# Patient Record
Sex: Female | Born: 1995 | Race: White | Hispanic: No | Marital: Single | Smoking: Never smoker
Health system: Southern US, Community
[De-identification: ages and names within clinical notes are randomized; demographics above are authoritative.]

## PROBLEM LIST (undated history)

## (undated) DIAGNOSIS — N200 Calculus of kidney: Secondary | ICD-10-CM

## (undated) HISTORY — PX: KIDNEY STONE SURGERY: SHX686

## (undated) HISTORY — PX: TONSILLECTOMY: SUR1361

---

## 2015-05-18 ENCOUNTER — Inpatient Hospital Stay (HOSPITAL_COMMUNITY)
Admission: EM | Admit: 2015-05-18 | Discharge: 2015-05-20 | DRG: 872 | Disposition: A | Discharge status: Home / Self Care | Payer: 59 | Attending: Internal Medicine | Admitting: Internal Medicine

## 2015-05-18 ENCOUNTER — Encounter (HOSPITAL_COMMUNITY): Payer: Self-pay | Admitting: Emergency Medicine

## 2015-05-18 ENCOUNTER — Emergency Department (HOSPITAL_COMMUNITY): Payer: 59

## 2015-05-18 DIAGNOSIS — R109 Unspecified abdominal pain: Secondary | ICD-10-CM

## 2015-05-18 DIAGNOSIS — I959 Hypotension, unspecified: Secondary | ICD-10-CM | POA: Diagnosis present

## 2015-05-18 DIAGNOSIS — A419 Sepsis, unspecified organism: Secondary | ICD-10-CM | POA: Diagnosis not present

## 2015-05-18 DIAGNOSIS — R1011 Right upper quadrant pain: Secondary | ICD-10-CM

## 2015-05-18 DIAGNOSIS — Z87442 Personal history of urinary calculi: Secondary | ICD-10-CM

## 2015-05-18 DIAGNOSIS — A0819 Acute gastroenteropathy due to other small round viruses: Secondary | ICD-10-CM | POA: Diagnosis present

## 2015-05-18 DIAGNOSIS — A047 Enterocolitis due to Clostridium difficile: Secondary | ICD-10-CM | POA: Diagnosis present

## 2015-05-18 DIAGNOSIS — A0811 Acute gastroenteropathy due to Norwalk agent: Secondary | ICD-10-CM | POA: Diagnosis present

## 2015-05-18 DIAGNOSIS — R000 Tachycardia, unspecified: Secondary | ICD-10-CM | POA: Diagnosis present

## 2015-05-18 DIAGNOSIS — R197 Diarrhea, unspecified: Secondary | ICD-10-CM

## 2015-05-18 DIAGNOSIS — Z8249 Family history of ischemic heart disease and other diseases of the circulatory system: Secondary | ICD-10-CM

## 2015-05-18 DIAGNOSIS — E872 Acidosis: Secondary | ICD-10-CM | POA: Diagnosis present

## 2015-05-18 DIAGNOSIS — E861 Hypovolemia: Secondary | ICD-10-CM | POA: Diagnosis present

## 2015-05-18 DIAGNOSIS — D649 Anemia, unspecified: Secondary | ICD-10-CM | POA: Diagnosis present

## 2015-05-18 DIAGNOSIS — Z793 Long term (current) use of hormonal contraceptives: Secondary | ICD-10-CM

## 2015-05-18 DIAGNOSIS — R112 Nausea with vomiting, unspecified: Secondary | ICD-10-CM | POA: Diagnosis present

## 2015-05-18 DIAGNOSIS — Z882 Allergy status to sulfonamides status: Secondary | ICD-10-CM

## 2015-05-18 DIAGNOSIS — E860 Dehydration: Secondary | ICD-10-CM | POA: Diagnosis present

## 2015-05-18 HISTORY — DX: Calculus of kidney: N20.0

## 2015-05-18 MED ORDER — MORPHINE SULFATE (PF) 4 MG/ML IV SOLN
6.0000 mg | Freq: Once | INTRAVENOUS | Status: AC
  Administered 2015-05-18: 6 mg via INTRAVENOUS
  Filled 2015-05-18: qty 2

## 2015-05-18 MED ORDER — ONDANSETRON HCL 4 MG/2ML IJ SOLN
4.0000 mg | Freq: Once | INTRAMUSCULAR | Status: AC
  Administered 2015-05-18: 4 mg via INTRAVENOUS
  Filled 2015-05-18: qty 2

## 2015-05-18 MED ORDER — SODIUM CHLORIDE 0.9 % IV BOLUS (SEPSIS)
2000.0000 mL | Freq: Once | INTRAVENOUS | Status: AC
Start: 2015-05-18 — End: 2015-05-19
  Administered 2015-05-18: 2000 mL via INTRAVENOUS

## 2015-05-18 NOTE — ED Notes (Signed)
Pt taken to US

## 2015-05-18 NOTE — ED Notes (Signed)
Pt states about two hours ago she had a sudden onset of abd pain with n/v/d. Pt states she is now having 10/10 lower back pain. Pt diaphoretic.

## 2015-05-18 NOTE — ED Provider Notes (Signed)
CSN: [id number]     Arrival date & time 05/18/15  2243 History  By signing my name below, I, Bethel BornBritney McCollum, attest that this documentation has been prepared under the direction and in the presence of Tomasita CrumbleAdeleke Londen Lorge, MD. Electronically Signed: Bethel BornBritney McCollum, ED Scribe. 05/18/2015. 11:24 PM    Chief Complaint  Patient presents with  . Abdominal Pain   The history is provided by the patient. No language interpreter was used.   Denise PitchHanah Avino is a 20 y.o. female who presents to the Emergency Department complaining of new, 10/10 in severity, sharp, upper abdominal pain with sudden onset 2 hours prior to arrival.  Associated symptoms include nausea, vomiting, diarrhea, and back pain. Pt was in her usual state of health earlier in the day and her symptoms started 1 hour after eating [employer]. She denies fever.   History reviewed. No pertinent past medical history. Past Surgical History  Procedure Laterality Date  . Tonsillectomy    . Kidney stone surgery     No family history on file. Social History  Substance Use Topics  . Smoking status: Never Smoker   . Smokeless tobacco: None  . Alcohol Use: No   OB History    No data available     Review of Systems  10 Systems reviewed and all are negative for acute change except as noted in the HPI.  Allergies  Sulfa antibiotics  Home Medications   Prior to Admission medications   Medication Sig Start Date End Date Taking? Authorizing Provider  YAZ 3-0.02 MG tablet Take 1 tablet by mouth daily. 05/02/15  Yes Historical Provider, MD   BP 83/65 mmHg  Pulse 137  Temp(Src) 97.7 F (36.5 C) (Oral)  SpO2 100%  LMP 05/16/2015 Physical Exam  Constitutional: She is oriented to person, place, and time. She appears well-developed and well-nourished. She appears distressed.  HENT:  Head: Normocephalic and atraumatic.  Nose: Nose normal.  Mouth/Throat: Oropharynx is clear and moist. No oropharyngeal exudate.  Eyes: Conjunctivae and EOM  are normal. Pupils are equal, round, and reactive to light. No scleral icterus.  Neck: Normal range of motion. Neck supple. No JVD present. No tracheal deviation present. No thyromegaly present.  Cardiovascular: Regular rhythm and normal heart sounds.  Tachycardia present.  Exam reveals no gallop and no friction rub.   No murmur heard. Pulmonary/Chest: Effort normal and breath sounds normal. No respiratory distress. She has no wheezes. She exhibits no tenderness.  Abdominal: Soft. Bowel sounds are normal. She exhibits no distension and no mass. There is tenderness. There is no rebound and no guarding.  RUQ TTP  Musculoskeletal: Normal range of motion. She exhibits no edema or tenderness.  Lymphadenopathy:    She has no cervical adenopathy.  Neurological: She is alert and oriented to person, place, and time. No cranial nerve deficit. She exhibits normal muscle tone.  Skin: Skin is warm and dry. No rash noted. No erythema. No pallor.  Nursing note and vitals reviewed.   ED Course  Procedures (including critical care time) DIAGNOSTIC STUDIES: Oxygen Saturation is 100% on RA,  normal by my interpretation.    COORDINATION OF CARE: 11:15 PM Discussed treatment plan which includes lab work, US of the RUQ, morphine, Zofran, and IVF with pt at bedside and pt agreed to plan.  Labs Review Labs Reviewed  COMPREHENSIVE METABOLIC PANEL - Abnormal; Notable for the following:    CO2 20 (*)    Glucose, Bld 156 (*)    All other components  within normal limits  CBC - Abnormal; Notable for the following:    WBC 16.7 (*)    Platelets 409 (*)    All other components within normal limits  URINALYSIS, ROUTINE W REFLEX MICROSCOPIC (NOT AT [facility]) - Abnormal; Notable for the following:    Color, Urine AMBER (*)    APPearance CLOUDY (*)    Ketones, ur 40 (*)    Leukocytes, UA SMALL (*)    All other components within normal limits  URINE MICROSCOPIC-ADD ON - Abnormal; Notable for the following:    Squamous  Epithelial / LPF 0-5 (*)    Bacteria, UA RARE (*)    All other components within normal limits  I-STAT CG4 LACTIC ACID, ED - Abnormal; Notable for the following:    Lactic Acid, Venous 3.80 (*)    All other components within normal limits  I-STAT CHEM 8, ED - Abnormal; Notable for the following:    Glucose, Bld 149 (*)    Calcium, Ion 1.09 (*)    Hemoglobin 15.6 (*)    All other components within normal limits  LIPASE, BLOOD  POC URINE PREG, ED  POC URINE PREG, ED  I-STAT BETA HCG BLOOD, ED ([facility], [facility], [facility] ONLY)  I-STAT CG4 LACTIC ACID, ED    Imaging Review Ct Abdomen Pelvis W Contrast  05/19/2015  CLINICAL DATA:  Acute onset of upper abdominal pain. Nausea, vomiting, diarrhea and lower back pain. Initial encounter. EXAM: CT ABDOMEN AND PELVIS WITH CONTRAST TECHNIQUE: Multidetector CT imaging of the abdomen and pelvis was performed using the standard protocol following bolus administration of intravenous contrast. CONTRAST:  30mL OMNIPAQUE IOHEXOL 300 MG/ML  SOLN COMPARISON:  Right upper quadrant ultrasound performed 05/18/2015 FINDINGS: The visualized lung bases are clear. The liver and spleen are unremarkable in appearance. The gallbladder is within normal limits. The pancreas and adrenal glands are unremarkable. The kidneys are unremarkable in appearance. There is no evidence of hydronephrosis. No renal or ureteral stones are seen. No perinephric stranding is appreciated. No free fluid is identified. The small bowel is unremarkable in appearance. The stomach is within normal limits. No acute vascular abnormalities are seen. The appendix is normal in caliber, without evidence of appendicitis. The colon is unremarkable in appearance. The bladder is mildly distended and grossly unremarkable. The uterus is unremarkable in appearance. The ovaries are relatively symmetric. No suspicious adnexal masses are seen. No inguinal lymphadenopathy is seen. No acute osseous abnormalities are identified.  IMPRESSION: Unremarkable noncontrast CT of the abdomen and pelvis. Electronically Signed   By: Roanna Raider M.D.   On: 05/19/2015 03:17   US Abdomen Limited Ruq  05/19/2015  CLINICAL DATA:  20 year old female with right upper quadrant abdominal pain EXAM: US ABDOMEN LIMITED - RIGHT UPPER QUADRANT COMPARISON:  None. FINDINGS: Gallbladder: No gallstones or wall thickening visualized. Evaluation for sonographic Murphy's sign was limited as patient was on pain medication. Common bile duct: Diameter: 3 mm Liver: No focal lesion identified. Within normal limits in parenchymal echogenicity. IMPRESSION: Unremarkable right upper quadrant ultrasound. Electronically Signed   By: Elgie Collard M.D.   On: 05/19/2015 00:14   I have personally reviewed and evaluated these images and lab results as part of my medical decision-making.   EKG Interpretation None      MDM   Final diagnoses:  Abdominal pain    Patient presents to the ED for severe abdominal pain of sudden onset this evening.  She states it was 1 hour after eating, I have concern  for possible RUQ pathology and will order [place].  She is hypotensive and tachycardic.  She was given IVF, morphine and zofran for her symptoms.    Patient continues to have hypotension and abdominal pain.  I am unsure what is causing this as all of her studies are unremarkable.  WBC is 16, lactate is 3.  Will admit to Dr. Zane Herald to telemetry for further care.  He advised me to place the patient on cipro/flagyl for treatment.   I personally performed the services described in this documentation, which was scribed in my presence. The recorded information has been reviewed and is accurate.       Tomasita Crumble, MD 05/19/15 [id number]

## 2015-05-19 ENCOUNTER — Encounter (HOSPITAL_COMMUNITY): Payer: Self-pay | Admitting: Radiology

## 2015-05-19 ENCOUNTER — Emergency Department (HOSPITAL_COMMUNITY): Payer: 59

## 2015-05-19 DIAGNOSIS — E860 Dehydration: Secondary | ICD-10-CM | POA: Diagnosis present

## 2015-05-19 DIAGNOSIS — A0819 Acute gastroenteropathy due to other small round viruses: Secondary | ICD-10-CM | POA: Diagnosis present

## 2015-05-19 DIAGNOSIS — R112 Nausea with vomiting, unspecified: Secondary | ICD-10-CM | POA: Diagnosis not present

## 2015-05-19 DIAGNOSIS — Z882 Allergy status to sulfonamides status: Secondary | ICD-10-CM | POA: Diagnosis not present

## 2015-05-19 DIAGNOSIS — R109 Unspecified abdominal pain: Secondary | ICD-10-CM | POA: Diagnosis not present

## 2015-05-19 DIAGNOSIS — A047 Enterocolitis due to Clostridium difficile: Secondary | ICD-10-CM | POA: Diagnosis present

## 2015-05-19 DIAGNOSIS — Z87442 Personal history of urinary calculi: Secondary | ICD-10-CM | POA: Diagnosis not present

## 2015-05-19 DIAGNOSIS — A419 Sepsis, unspecified organism: Principal | ICD-10-CM

## 2015-05-19 DIAGNOSIS — R197 Diarrhea, unspecified: Secondary | ICD-10-CM | POA: Diagnosis not present

## 2015-05-19 DIAGNOSIS — Z793 Long term (current) use of hormonal contraceptives: Secondary | ICD-10-CM | POA: Diagnosis not present

## 2015-05-19 DIAGNOSIS — E872 Acidosis: Secondary | ICD-10-CM | POA: Diagnosis present

## 2015-05-19 DIAGNOSIS — A0811 Acute gastroenteropathy due to Norwalk agent: Secondary | ICD-10-CM | POA: Diagnosis not present

## 2015-05-19 DIAGNOSIS — Z8249 Family history of ischemic heart disease and other diseases of the circulatory system: Secondary | ICD-10-CM | POA: Diagnosis not present

## 2015-05-19 DIAGNOSIS — D649 Anemia, unspecified: Secondary | ICD-10-CM | POA: Diagnosis present

## 2015-05-19 DIAGNOSIS — R000 Tachycardia, unspecified: Secondary | ICD-10-CM | POA: Diagnosis present

## 2015-05-19 DIAGNOSIS — E861 Hypovolemia: Secondary | ICD-10-CM | POA: Diagnosis present

## 2015-05-19 DIAGNOSIS — I959 Hypotension, unspecified: Secondary | ICD-10-CM | POA: Diagnosis present

## 2015-05-19 LAB — COMPREHENSIVE METABOLIC PANEL
ALK PHOS: 29 U/L — AB (ref 38–126)
ALT: 16 U/L (ref 14–54)
ALT: 12 U/L — AB (ref 14–54)
AST: 24 U/L (ref 15–41)
AST: 16 U/L (ref 15–41)
Albumin: 3.9 g/dL (ref 3.5–5.0)
Albumin: 2.8 g/dL — ABNORMAL LOW (ref 3.5–5.0)
Alkaline Phosphatase: 42 U/L (ref 38–126)
Anion gap: 13 (ref 5–15)
Anion gap: 9 (ref 5–15)
BILIRUBIN TOTAL: 0.7 mg/dL (ref 0.3–1.2)
BILIRUBIN TOTAL: 0.7 mg/dL (ref 0.3–1.2)
BUN: 13 mg/dL (ref 6–20)
BUN: 10 mg/dL (ref 6–20)
CALCIUM: 7.5 mg/dL — AB (ref 8.9–10.3)
CHLORIDE: 109 mmol/L (ref 101–111)
CO2: 20 mmol/L — ABNORMAL LOW (ref 22–32)
CO2: 20 mmol/L — ABNORMAL LOW (ref 22–32)
CREATININE: 0.88 mg/dL (ref 0.44–1.00)
CREATININE: 0.76 mg/dL (ref 0.44–1.00)
Calcium: 9.4 mg/dL (ref 8.9–10.3)
Chloride: 114 mmol/L — ABNORMAL HIGH (ref 101–111)
GFR calc Af Amer: >60 mL/min (ref >60)
Glucose, Bld: 156 mg/dL — ABNORMAL HIGH (ref 65–99)
Glucose, Bld: 118 mg/dL — ABNORMAL HIGH (ref 65–99)
Potassium: 4.2 mmol/L (ref 3.5–5.1)
Potassium: 4 mmol/L (ref 3.5–5.1)
Sodium: 142 mmol/L (ref 135–145)
Sodium: 143 mmol/L (ref 135–145)
TOTAL PROTEIN: 6.6 g/dL (ref 6.5–8.1)
TOTAL PROTEIN: 4.4 g/dL — AB (ref 6.5–8.1)

## 2015-05-19 LAB — CBC
HCT: 41.8 % (ref 36.0–46.0)
HCT: 32 % — ABNORMAL LOW (ref 36.0–46.0)
HEMOGLOBIN: 10.7 g/dL — AB (ref 12.0–15.0)
Hemoglobin: 13.8 g/dL (ref 12.0–15.0)
MCH: 28.2 pg (ref 26.0–34.0)
MCH: 28.9 pg (ref 26.0–34.0)
MCHC: 33 g/dL (ref 30.0–36.0)
MCHC: 33.4 g/dL (ref 30.0–36.0)
MCV: 85.5 fL (ref 78.0–100.0)
MCV: 86.5 fL (ref 78.0–100.0)
PLATELETS: 409 10*3/uL — AB (ref 150–400)
PLATELETS: 288 10*3/uL (ref 150–400)
RBC: 4.89 MIL/uL (ref 3.87–5.11)
RBC: 3.7 MIL/uL — AB (ref 3.87–5.11)
RDW: 12 % (ref 11.5–15.5)
RDW: 12.4 % (ref 11.5–15.5)
WBC: 16.7 10*3/uL — AB (ref 4.0–10.5)
WBC: 8.2 10*3/uL (ref 4.0–10.5)

## 2015-05-19 LAB — C DIFFICILE QUICK SCREEN W PCR REFLEX
C Diff antigen: POSITIVE — AB
C Diff toxin: NEGATIVE

## 2015-05-19 LAB — URINE MICROSCOPIC-ADD ON

## 2015-05-19 LAB — I-STAT CHEM 8, ED
BUN: 15 mg/dL (ref 6–20)
CALCIUM ION: 1.09 mmol/L — AB (ref 1.12–1.23)
Chloride: 106 mmol/L (ref 101–111)
Creatinine, Ser: 0.7 mg/dL (ref 0.44–1.00)
GLUCOSE: 149 mg/dL — AB (ref 65–99)
HCT: 46 % (ref 36.0–46.0)
HEMOGLOBIN: 15.6 g/dL — AB (ref 12.0–15.0)
Potassium: 4.1 mmol/L (ref 3.5–5.1)
SODIUM: 141 mmol/L (ref 135–145)
TCO2: 20 mmol/L (ref 0–100)

## 2015-05-19 LAB — I-STAT BETA HCG BLOOD, ED (MC, WL, AP ONLY)

## 2015-05-19 LAB — GASTROINTESTINAL PANEL BY PCR, STOOL (REPLACES STOOL CULTURE)
ADENOVIRUS F40/41: NOT DETECTED
Astrovirus: NOT DETECTED
CYCLOSPORA CAYETANENSIS: NOT DETECTED
Campylobacter species: NOT DETECTED
Cryptosporidium: NOT DETECTED
E. COLI O157: NOT DETECTED
ENTAMOEBA HISTOLYTICA: NOT DETECTED
ENTEROPATHOGENIC E COLI (EPEC): NOT DETECTED
Enteroaggregative E coli (EAEC): NOT DETECTED
Enterotoxigenic E coli (ETEC): NOT DETECTED
Giardia lamblia: NOT DETECTED
Norovirus GI/GII: DETECTED — AB
Plesimonas shigelloides: NOT DETECTED
ROTAVIRUS A: NOT DETECTED
SAPOVIRUS (I, II, IV, AND V): NOT DETECTED
SHIGA LIKE TOXIN PRODUCING E COLI (STEC): NOT DETECTED
SHIGELLA/ENTEROINVASIVE E COLI (EIEC): NOT DETECTED
Salmonella species: NOT DETECTED
VIBRIO SPECIES: NOT DETECTED
Vibrio cholerae: NOT DETECTED
YERSINIA ENTEROCOLITICA: NOT DETECTED

## 2015-05-19 LAB — PROCALCITONIN: Procalcitonin: 2.5 ng/mL

## 2015-05-19 LAB — HIV ANTIBODY (ROUTINE TESTING W REFLEX): HIV Screen 4th Generation wRfx: NONREACTIVE

## 2015-05-19 LAB — URINALYSIS, ROUTINE W REFLEX MICROSCOPIC
Bilirubin Urine: NEGATIVE
GLUCOSE, UA: NEGATIVE mg/dL
Hgb urine dipstick: NEGATIVE
KETONES UR: 40 mg/dL — AB
NITRITE: NEGATIVE
PROTEIN: NEGATIVE mg/dL
Specific Gravity, Urine: 1.021 (ref 1.005–1.030)
pH: 6 (ref 5.0–8.0)

## 2015-05-19 LAB — I-STAT CG4 LACTIC ACID, ED
LACTIC ACID, VENOUS: 0.82 mmol/L (ref 0.5–2.0)
LACTIC ACID, VENOUS: 3.8 mmol/L — AB (ref 0.5–2.0)

## 2015-05-19 LAB — PROTIME-INR
INR: 1.36 (ref 0.00–1.49)
Prothrombin Time: 16.9 seconds — ABNORMAL HIGH (ref 11.6–15.2)

## 2015-05-19 LAB — LACTIC ACID, PLASMA
LACTIC ACID, VENOUS: 1 mmol/L (ref 0.5–2.0)
Lactic Acid, Venous: 1.1 mmol/L (ref 0.5–2.0)

## 2015-05-19 LAB — POC URINE PREG, ED: PREG TEST UR: NEGATIVE

## 2015-05-19 LAB — APTT: aPTT: 26 seconds (ref 24–37)

## 2015-05-19 LAB — LIPASE, BLOOD: LIPASE: 32 U/L (ref 11–51)

## 2015-05-19 MED ORDER — FENTANYL CITRATE (PF) 100 MCG/2ML IJ SOLN
50.0000 ug | Freq: Once | INTRAMUSCULAR | Status: AC
  Administered 2015-05-19: 50 ug via INTRAVENOUS
  Filled 2015-05-19: qty 2

## 2015-05-19 MED ORDER — METOCLOPRAMIDE HCL 5 MG/ML IJ SOLN
10.0000 mg | Freq: Once | INTRAMUSCULAR | Status: AC
Start: 2015-05-19 — End: 2015-05-19
  Administered 2015-05-19: 10 mg via INTRAVENOUS
  Filled 2015-05-19: qty 2

## 2015-05-19 MED ORDER — ENOXAPARIN SODIUM 40 MG/0.4ML ~~LOC~~ SOLN: 40.0000 mg | SUBCUTANEOUS | Status: DC

## 2015-05-19 MED ORDER — ACETAMINOPHEN 650 MG RE SUPP: 650.0000 mg | Freq: Four times a day (QID) | RECTAL | Status: DC | PRN

## 2015-05-19 MED ORDER — DROSPIRENONE-ETHINYL ESTRADIOL 3-0.02 MG PO TABS: 1.0000 | ORAL_TABLET | Freq: Every day | ORAL | Status: DC

## 2015-05-19 MED ORDER — DIPHENHYDRAMINE HCL 50 MG/ML IJ SOLN
25.0000 mg | Freq: Once | INTRAMUSCULAR | Status: AC
  Administered 2015-05-19: 25 mg via INTRAVENOUS
  Filled 2015-05-19: qty 1

## 2015-05-19 MED ORDER — SODIUM CHLORIDE 0.9% FLUSH: 3.0000 mL | Freq: Two times a day (BID) | INTRAVENOUS | Status: DC

## 2015-05-19 MED ORDER — ONDANSETRON HCL 4 MG/2ML IJ SOLN: 4.0000 mg | Freq: Three times a day (TID) | INTRAMUSCULAR | Status: DC | PRN

## 2015-05-19 MED ORDER — IOHEXOL 300 MG/ML  SOLN
80.0000 mL | Freq: Once | INTRAMUSCULAR | Status: AC | PRN
  Administered 2015-05-19: 30 mL via INTRAVENOUS

## 2015-05-19 MED ORDER — MORPHINE SULFATE (PF) 2 MG/ML IV SOLN
2.0000 mg | INTRAVENOUS | Status: DC | PRN
  Administered 2015-05-19: 2 mg via INTRAVENOUS
  Filled 2015-05-19: qty 1

## 2015-05-19 MED ORDER — METRONIDAZOLE IN NACL 5-0.79 MG/ML-% IV SOLN
500.0000 mg | Freq: Once | INTRAVENOUS | Status: DC
Start: 2015-05-19 — End: 2015-05-19

## 2015-05-19 MED ORDER — CIPROFLOXACIN IN D5W 400 MG/200ML IV SOLN: 400.0000 mg | Freq: Two times a day (BID) | INTRAVENOUS | Status: DC

## 2015-05-19 MED ORDER — SODIUM CHLORIDE 0.9 % IV SOLN
INTRAVENOUS | Status: DC
  Administered 2015-05-19 – 2015-05-20 (×2): via INTRAVENOUS

## 2015-05-19 MED ORDER — METRONIDAZOLE IN NACL 5-0.79 MG/ML-% IV SOLN
500.0000 mg | Freq: Three times a day (TID) | INTRAVENOUS | Status: DC
  Administered 2015-05-19 (×2): 500 mg via INTRAVENOUS
  Filled 2015-05-19 (×2): qty 100

## 2015-05-19 MED ORDER — SODIUM CHLORIDE 0.9 % IV BOLUS (SEPSIS)
1000.0000 mL | Freq: Once | INTRAVENOUS | Status: AC
  Administered 2015-05-19: 1000 mL via INTRAVENOUS

## 2015-05-19 MED ORDER — CIPROFLOXACIN IN D5W 400 MG/200ML IV SOLN
400.0000 mg | Freq: Once | INTRAVENOUS | Status: AC
  Administered 2015-05-19: 400 mg via INTRAVENOUS
  Filled 2015-05-19: qty 200

## 2015-05-19 MED ORDER — VANCOMYCIN 50 MG/ML ORAL SOLUTION
125.0000 mg | Freq: Four times a day (QID) | ORAL | Status: DC
  Administered 2015-05-19 – 2015-05-20 (×4): 125 mg via ORAL
  Filled 2015-05-19 (×4): qty 2.5
  Filled 2015-05-19: qty 3
  Filled 2015-05-19: qty 2.5

## 2015-05-19 MED ORDER — ACETAMINOPHEN 325 MG PO TABS
650.0000 mg | ORAL_TABLET | Freq: Four times a day (QID) | ORAL | Status: DC | PRN
  Administered 2015-05-19: 650 mg via ORAL
  Filled 2015-05-19: qty 2

## 2015-05-19 NOTE — Progress Notes (Signed)
[facility] PROGRESS NOTE  Denise Joyce MVH:[id number] DOB: 12/16/1995 DOA: 05/18/2015  PCP: No primary care provider on file.  Brief HPI: 20 year old Caucasian female with a past medical history of nephrolithiasis, presented with nausea, vomiting, diarrhea and abdominal pain. CT scan of the abdomen, pelvis was unremarkable for any acute process. Right upper quadrant ultrasound did not show any acute findings in the hepatobiliary system. Patient was hospitalized for further management.  Past medical history:  Past Medical History  Diagnosis Date  . Kidney stone     Consultants: None  Procedures: None  Antibiotics: Intravenous Cipro and Flagyl Oral vancomycin  Subjective: Patient continues to have nausea and has had one episode of vomiting overnight. She's had 3 episodes of loose stool which is not as loose as yesterday but with small quantity of blood. Her symptoms started yesterday evening. 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 Otherwise, denies any sick contacts. She did eat at a [employer] yesterday. However, her boyfriend also ate there and did not have any symptoms. Denies any recent antibiotic use.  Objective: Vital Signs  Filed Vitals:   05/19/15 0400 05/19/15 0430 05/19/15 0523 05/19/15 0625  BP: 95/41 95/51  95/33  Pulse: 98 96  103  Temp:   99.3 F (37.4 C) 100.1 F (37.8 C)  TempSrc:    Oral  Resp:    16  Height:    5\' 6"  (1.676 m)  Weight:    72.303 kg (159 lb 6.4 oz)  SpO2: 98% 99%  100%    Intake/Output Summary (Last 24 hours) at 05/19/15 1241 Last data filed at 05/19/15 1100  Gross per 24 hour  Intake   4120 ml  Output    300 ml  Net   3820 ml   Filed Weights   05/19/15 0625  Weight: 72.303 kg (159 lb 6.4 oz)    General appearance: alert, cooperative, appears stated age and no distress Resp: clear to auscultation bilaterally Cardio: regular rate and rhythm, S1, S2 normal, no murmur, click, rub or gallop GI: Abdomen is  soft. Tender in the right upper quadrant without any rebound, rigidity or guarding. No masses or organomegaly. Bowel sounds are present.  Examination of the back did not reveal any tenderness in the costovertebral angle. She did have some discomfort in the lower back and paraspinal areas. Extremities: extremities normal, atraumatic, no cyanosis or edema. Able to lift both legs off the bed without any difficulty. Neurologic: Awake and alert. Oriented 3. No focal neurological deficits.  Lab Results:  Basic Metabolic Panel:  Recent Labs Lab 05/18/15 2315 05/19/15 0011 05/19/15 0456  NA 142 141 143  K 4.2 4.1 4.0  CL 109 106 114*  CO2 20*  --  20*  GLUCOSE 156* 149* 118*  BUN 13 15 10   CREATININE 0.88 0.70 0.76  CALCIUM 9.4  --  7.5*   Liver Function Tests:  Recent Labs Lab 05/18/15 2315 05/19/15 0456  AST 24 16  ALT 16 12*  ALKPHOS 42 29*  BILITOT 0.7 0.7  PROT 6.6 4.4*  ALBUMIN 3.9 2.8*    Recent Labs Lab 05/18/15 2315  LIPASE 32   CBC:  Recent Labs Lab 05/18/15 2315 05/19/15 0011 05/19/15 0457  WBC 16.7*  --  8.2  HGB 13.8 15.6* 10.7*  HCT 41.8 46.0 32.0*  MCV 85.5  --  86.5  PLT 409*  --  288    Recent Results (from the past 240 hour(s))  Urine culture  Status: None (Preliminary result)   Collection Time: 05/19/15  1:33 AM  Result Value Ref Range Status   Specimen Description URINE, RANDOM  Final   Special Requests ADDED [phone]  Final   Culture PENDING  Incomplete   Report Status PENDING  Incomplete  C difficile quick scan w PCR reflex     Status: Abnormal   Collection Time: 05/19/15  6:41 AM  Result Value Ref Range Status   C Diff antigen POSITIVE (A) NEGATIVE Final   C Diff toxin NEGATIVE NEGATIVE Final   C Diff interpretation   Final    C. difficile present, but toxin not detected. This indicates colonization. In most cases, this does not require treatment. If patient has signs and symptoms consistent with colitis, consider treatment.  Requires ENTERIC precautions.      Studies/Results: Ct Abdomen Pelvis W Contrast  05/19/2015  CLINICAL DATA:  Acute onset of upper abdominal pain. Nausea, vomiting, diarrhea and lower back pain. Initial encounter. EXAM: CT ABDOMEN AND PELVIS WITH CONTRAST TECHNIQUE: Multidetector CT imaging of the abdomen and pelvis was performed using the standard protocol following bolus administration of intravenous contrast. CONTRAST:  30mL OMNIPAQUE IOHEXOL 300 MG/ML  SOLN COMPARISON:  Right upper quadrant ultrasound performed 05/18/2015 FINDINGS: The visualized lung bases are clear. The liver and spleen are unremarkable in appearance. The gallbladder is within normal limits. The pancreas and adrenal glands are unremarkable. The kidneys are unremarkable in appearance. There is no evidence of hydronephrosis. No renal or ureteral stones are seen. No perinephric stranding is appreciated. No free fluid is identified. The small bowel is unremarkable in appearance. The stomach is within normal limits. No acute vascular abnormalities are seen. The appendix is normal in caliber, without evidence of appendicitis. The colon is unremarkable in appearance. The bladder is mildly distended and grossly unremarkable. The uterus is unremarkable in appearance. The ovaries are relatively symmetric. No suspicious adnexal masses are seen. No inguinal lymphadenopathy is seen. No acute osseous abnormalities are identified. IMPRESSION: Unremarkable noncontrast CT of the abdomen and pelvis. Electronically Signed   By: Roanna Raider M.D.   On: 05/19/2015 03:17   US Abdomen Limited Ruq  05/19/2015  CLINICAL DATA:  20 year old female with right upper quadrant abdominal pain EXAM: US ABDOMEN LIMITED - RIGHT UPPER QUADRANT COMPARISON:  None. FINDINGS: Gallbladder: No gallstones or wall thickening visualized. Evaluation for sonographic Murphy's sign was limited as patient was on pain medication. Common bile duct: Diameter: 3 mm Liver: No focal  lesion identified. Within normal limits in parenchymal echogenicity. IMPRESSION: Unremarkable right upper quadrant ultrasound. Electronically Signed   By: Elgie Collard M.D.   On: 05/19/2015 00:14    Medications:  Scheduled: . ciprofloxacin  400 mg Intravenous Q12H  . drospirenone-ethinyl estradiol  1 tablet Oral Daily  . enoxaparin (LOVENOX) injection  40 mg Subcutaneous Q24H  . metronidazole  500 mg Intravenous Q8H  . sodium chloride flush  3 mL Intravenous Q12H  . vancomycin  125 mg Oral 4 times per day   Continuous: . sodium chloride 100 mL/hr at 05/19/15 1017   RUE:[id number] **OR** acetaminophen, morphine injection, ondansetron  Assessment/Plan:  Principal Problem:   Nausea vomiting and diarrhea Active Problems:   Abdominal pain   Sepsis (HCC)    Nausea, vomiting and diarrhea Symptoms are suggestive of acute gastroenteritis. Her C. difficile antigen is positive. However, toxin is negative. She works in an assisted living facility as a Copy. Unclear if this is a clinically significant C. difficile infection or  not. She did have elevated WBC when she presented. Continue with Cipro and Flagyl intravenously. Add oral vancomycin for now. Monitor clinically for now. Continue IV fluids. GI pathogen panel is pending. Patient does have right upper quadrant abdominal tenderness, however, LFTs are normal. Her ultrasound did not show any gallbladder abnormalities. UA does not show any blood.   Dehydration with lactic acidosis Likely due to hypovolemia. Lactic acid has improved.  Normocytic anemia Slight drop in hemoglobin is likely dilutional. Continue to monitor. Patient tells me that the last few episodes of diarrhea did have small quantities of blood. Monitor for now.  DVT Prophylaxis: Hold Lovenox for now. SCDs    Code Status: Full code  Family Communication: Discussed with the patient and her mother  Disposition Plan: Continue management as outlined above.       [facility]  [facility] Pager [phone] 05/19/2015, 12:41 PM  If 7PM-7AM, please contact night-coverage at [email or web address], password [facility]

## 2015-05-19 NOTE — Progress Notes (Signed)
NURSING PROGRESS NOTE  Denise Joyce [id number] Admission Data: 05/19/2015 6:57 AM Attending Provider: Osvaldo ShipperGokul Krishnan, MD PCP:No primary care provider on file. Code Status: Full   Denise Joyce is a 20 y.o. female patient admitted from ED:  -No acute distress noted.  -No complaints of shortness of breath.  -No complaints of chest pain.   Cardiac Monitoring: Box # 19 in place. Cardiac monitor yields:sinus tachycardia.  Blood pressure 95/33, pulse 103, temperature 100.1 F (37.8 C), temperature source Oral, resp. rate 16, height 5\' 6"  (1.676 m), weight 72.303 kg (159 lb 6.4 oz), last menstrual period 05/16/2015, SpO2 100 %.   IV Fluids:  IV in place, occlusive dsg intact without redness, IV cath antecubital left, condition patent and no redness normal saline.   Allergies:  Sulfa antibiotics  Past Medical History:   has a past medical history of Kidney stone.  Past Surgical History:   has past surgical history that includes Tonsillectomy and Kidney stone surgery.  Social History:   reports that she has never smoked. She does not have any smokeless tobacco history on file. She reports that she does not drink alcohol or use illicit drugs.  Skin: intact   Patient/Family orientated to room. Information packet given to patient/family. Admission inpatient armband information verified with patient/family to include name and date of birth and placed on patient arm. Side rails up x 2, fall assessment and education completed with patient/family. Patient/family able to verbalize understanding of risk associated with falls and verbalized understanding to call for assistance before getting out of bed. Call light within reach. Patient/family able to voice and demonstrate understanding of unit orientation instructions.

## 2015-05-19 NOTE — ED Notes (Signed)
Med given for abd and head pain  The pt has migraines and she is having one with this pain

## 2015-05-19 NOTE — ED Notes (Signed)
Pain stil in abd but better

## 2015-05-19 NOTE — ED Notes (Signed)
The pt reports that she thinks the pain in her back is returning

## 2015-05-19 NOTE — ED Notes (Signed)
To ct

## 2015-05-19 NOTE — ED Notes (Signed)
Pt up to the br  With vomiting and her aBD PAIN IS RETURNING

## 2015-05-19 NOTE — ED Notes (Signed)
The pt  Is c/o flank lower abd pain since 1900 today  She has vomited x 6 times  nss has infused.  Hx kidney stones.   She is smiling at present  She states her pain is better.  No bloody urine no  Urinary symptoms.

## 2015-05-19 NOTE — ED Notes (Signed)
Report called to rn on 5 w 

## 2015-05-19 NOTE — Progress Notes (Signed)
Report received from C Chrisco, RN from ED. Pt is to be admitted to [phone]. Awaiting pt's arrival.

## 2015-05-19 NOTE — Progress Notes (Signed)
Pharmacy Antibiotic Note  Denise Joyce is a 20 y.o. female admitted on 05/18/2015 with intra-abdominal infection.  Pharmacy has been consulted for Cipro dosing.  Plan: Cipro 400mg  IV Q12H.  Pharmacy will sign off.   Temp (24hrs), Avg:97.7 F (36.5 C), Min:97.7 F (36.5 C), Max:97.7 F (36.5 C)   Recent Labs Lab 05/18/15 2315 05/19/15 0011 05/19/15 0012 05/19/15 0349  WBC 16.7*  --   --   --   CREATININE 0.88 0.70  --   --   LATICACIDVEN  --   --  3.80* 0.82      Allergies  Allergen Reactions  . Sulfa Antibiotics Hives      Thank you for allowing pharmacy to be a part of this patient's care.  Vernard GamblesVeronda Belton Peplinski, PharmD, BCPS  05/19/2015 4:31 AM

## 2015-05-19 NOTE — H&P (Signed)
[facility] History and Physical  Forest Pruden ZOX:[id number] DOB: Sep 22, 1995 DOA: 05/18/2015  Referring physician: ED physician PCP: No primary care provider on file.  Specialists:   Chief Complaint: Nausea, vomiting, diarrhea, abdominal pain  HPI: Denise Joyce is a 20 y.o. female with PMH of kidney stone, who presents with nausea, vomiting, diarrhea and abdominal pain.  Patient reports that she started having sudden onset nausea, vomiting, diarrhea and abdominal pain at about 7 PM. She vomited 6 times without blood in the vomitus. She had more than 10 times of bowel movement with watery stool. No recent antibiotics use. Her abdominal pain is located periumbilical area and right side of abdomen. It has been constant and severe. Patient denies symptoms of UTI. No chest pain, cough, shortness of breath or unilateral weakness.  In ED, patient was found to have WBC 16.7, temperature 97.7, tachycardia, tachypnea, lactate is 3.80, lipase 32, negative pregnancy test, urinalysis with small amount of leukocytes. Pt was hypotensive with blood pressure 83/65 which responded to IV fluid and improved to 95/57. CT abdomen/pelvis with contrast is negative for acute abnormalities. Abdominal ultrasound is negative for acute abnormalities. Patient is admitted to inpatient for further eval and treatment and observation.  EKG: Not done in ED, will get one.   Where does patient live?   At home  Can patient participate in ADLs?  Yes   Review of Systems:   General: no fevers, chills, no changes in body weight, has poor appetite, has fatigue HEENT: no blurry vision, hearing changes or sore throat Pulm: no dyspnea, coughing, wheezing CV: no chest pain, no palpitations Abd: has nausea, vomiting, abdominal pain, diarrhea, no constipation GU: no dysuria, burning on urination, increased urinary frequency, hematuria  Ext: no leg edema Neuro: no unilateral weakness, numbness, or tingling, no vision  change or hearing loss Skin: no rash MSK: No muscle spasm, no deformity, no limitation of range of movement in spin Heme: No easy bruising.  Travel history: No recent long distant travel.  Allergy:  Allergies  Allergen Reactions  . Sulfa Antibiotics Hives    Past Medical History  Diagnosis Date  . Kidney stone     Past Surgical History  Procedure Laterality Date  . Tonsillectomy    . Kidney stone surgery      Social History:  reports that she has never smoked. She does not have any smokeless tobacco history on file. She reports that she does not drink alcohol or use illicit drugs.  Family History:  Family History  Problem Relation Age of Onset  . Gallstones Mother   . Pancreatitis Father   . Heart disease Brother      Prior to Admission medications   Medication Sig Start Date End Date Taking? Authorizing Provider  YAZ 3-0.02 MG tablet Take 1 tablet by mouth daily. 05/02/15  Yes Historical Provider, MD    Physical Exam: Filed Vitals:   05/19/15 0100 05/19/15 0115 05/19/15 0145 05/19/15 0200  BP: 97/57 102/56 97/60 95/57   Pulse: 101 99 100 97  Temp:      TempSrc:      Resp: 18 17    SpO2: 100% 100% 97% 100%   General: Not in acute distress HEENT:       Eyes: PERRL, EOMI, no scleral icterus.       ENT: No discharge from the ears and nose, no pharynx injection, no tonsillar enlargement.        Neck: No JVD, no bruit, no mass felt. Heme: No neck  lymph node enlargement. Cardiac: S1/S2, RRR, Tachycardia, No murmurs, No gallops or rubs. Pulm: No rales, wheezing, rhonchi or rubs. Abd: Soft, nondistended, nontender, no rebound pain, no organomegaly, BS present. Ext: No pitting leg edema bilaterally. 2+DP/PT pulse bilaterally. Musculoskeletal: No joint deformities, No joint redness or warmth, no limitation of ROM in spin. Skin: No rashes.  Neuro: Alert, oriented X3, cranial nerves II-XII grossly intact, moves all extremities normally. Psych: Patient is not psychotic,  no suicidal or hemocidal ideation.  Labs on Admission:  Basic Metabolic Panel:  Recent Labs Lab 05/18/15 2315 05/19/15 0011  NA 142 141  K 4.2 4.1  CL 109 106  CO2 20*  --   GLUCOSE 156* 149*  BUN 13 15  CREATININE 0.88 0.70  CALCIUM 9.4  --    Liver Function Tests:  Recent Labs Lab 05/18/15 2315  AST 24  ALT 16  ALKPHOS 42  BILITOT 0.7  PROT 6.6  ALBUMIN 3.9    Recent Labs Lab 05/18/15 2315  LIPASE 32   No results for input(s): AMMONIA in the last 168 hours. CBC:  Recent Labs Lab 05/18/15 2315 05/19/15 0011  WBC 16.7*  --   HGB 13.8 15.6*  HCT 41.8 46.0  MCV 85.5  --   PLT 409*  --    Cardiac Enzymes: No results for input(s): CKTOTAL, CKMB, CKMBINDEX, TROPONINI in the last 168 hours.  BNP (last 3 results) No results for input(s): BNP in the last 8760 hours.  ProBNP (last 3 results) No results for input(s): PROBNP in the last 8760 hours.  CBG: No results for input(s): GLUCAP in the last 168 hours.  Radiological Exams on Admission: Ct Abdomen Pelvis W Contrast  05/19/2015  CLINICAL DATA:  Acute onset of upper abdominal pain. Nausea, vomiting, diarrhea and lower back pain. Initial encounter. EXAM: CT ABDOMEN AND PELVIS WITH CONTRAST TECHNIQUE: Multidetector CT imaging of the abdomen and pelvis was performed using the standard protocol following bolus administration of intravenous contrast. CONTRAST:  30mL OMNIPAQUE IOHEXOL 300 MG/ML  SOLN COMPARISON:  Right upper quadrant ultrasound performed 05/18/2015 FINDINGS: The visualized lung bases are clear. The liver and spleen are unremarkable in appearance. The gallbladder is within normal limits. The pancreas and adrenal glands are unremarkable. The kidneys are unremarkable in appearance. There is no evidence of hydronephrosis. No renal or ureteral stones are seen. No perinephric stranding is appreciated. No free fluid is identified. The small bowel is unremarkable in appearance. The stomach is within normal  limits. No acute vascular abnormalities are seen. The appendix is normal in caliber, without evidence of appendicitis. The colon is unremarkable in appearance. The bladder is mildly distended and grossly unremarkable. The uterus is unremarkable in appearance. The ovaries are relatively symmetric. No suspicious adnexal masses are seen. No inguinal lymphadenopathy is seen. No acute osseous abnormalities are identified. IMPRESSION: Unremarkable noncontrast CT of the abdomen and pelvis. Electronically Signed   By: Roanna Raider M.D.   On: 05/19/2015 03:17   US Abdomen Limited Ruq  05/19/2015  CLINICAL DATA:  20 year old female with right upper quadrant abdominal pain EXAM: US ABDOMEN LIMITED - RIGHT UPPER QUADRANT COMPARISON:  None. FINDINGS: Gallbladder: No gallstones or wall thickening visualized. Evaluation for sonographic Murphy's sign was limited as patient was on pain medication. Common bile duct: Diameter: 3 mm Liver: No focal lesion identified. Within normal limits in parenchymal echogenicity. IMPRESSION: Unremarkable right upper quadrant ultrasound. Electronically Signed   By: Elgie Collard M.D.   On: 05/19/2015 00:14  Assessment/Plan Principal Problem:   Nausea vomiting and diarrhea Active Problems:   Abdominal pain   Sepsis (HCC)   AP, Nausea, vomiting, diarrhea and sepsis: Etiology is not clear. Likely due to viral gastroenteritis, but cannot rule out other possibilities, such as C. difficile colitis. Patient had a history of kidney stone and is s/p of lithotripsy per her mother. Kidney stone is a potential differential diagnosis, but kidney stone should not cause severe diarrhea. Urinalysis has small amount of leukocytes, but the patient is asymptomatic, less likely to have UTI. Patient is septic with leukocytosis, hypotension, tachycardia, tachypnea and elevated lactate 3.80. Blood pressure responded to IV fluid, improved to 95/57 after 3 L of normal saline bolus.  -will admit tele  bed for observation.  -start cipro and flagyl --will get Procalcitonin and trend lactic acid levels per sepsis protocol. -IVF: will give another 1 L of NS, to add up to 4 L in total, then 125 cc/h  -f/u Bx and Ux -When necessary Zofran for nausea and morphine for pain. -Check C. difficile PCR and GI path and panel   DVT ppx: SQ Lovenox  Code Status: Full code Family Communication:  Yes, patient's mother at bed side Disposition Plan: Admit to inpatient   Date of Service 05/19/2015    Lorretta HarpIU, Love Chowning [facility] Pager [phone]  If 7PM-7AM, please contact night-coverage [email or web address] Password [email or web address] 05/19/2015, 4:34 AM

## 2015-05-20 DIAGNOSIS — A0811 Acute gastroenteropathy due to Norwalk agent: Secondary | ICD-10-CM

## 2015-05-20 LAB — COMPREHENSIVE METABOLIC PANEL
ALK PHOS: 26 U/L — AB (ref 38–126)
ALT: 11 U/L — AB (ref 14–54)
AST: 15 U/L (ref 15–41)
Albumin: 2.4 g/dL — ABNORMAL LOW (ref 3.5–5.0)
Anion gap: 5 (ref 5–15)
CALCIUM: 7.5 mg/dL — AB (ref 8.9–10.3)
CO2: 24 mmol/L (ref 22–32)
CREATININE: 0.6 mg/dL (ref 0.44–1.00)
Chloride: 114 mmol/L — ABNORMAL HIGH (ref 101–111)
Glucose, Bld: 88 mg/dL (ref 65–99)
Potassium: 3 mmol/L — ABNORMAL LOW (ref 3.5–5.1)
Sodium: 143 mmol/L (ref 135–145)
Total Bilirubin: 0.3 mg/dL (ref 0.3–1.2)
Total Protein: 4.2 g/dL — ABNORMAL LOW (ref 6.5–8.1)

## 2015-05-20 LAB — URINE CULTURE

## 2015-05-20 LAB — CBC
HCT: 30.5 % — ABNORMAL LOW (ref 36.0–46.0)
HEMOGLOBIN: 9.9 g/dL — AB (ref 12.0–15.0)
MCH: 28.3 pg (ref 26.0–34.0)
MCHC: 32.5 g/dL (ref 30.0–36.0)
MCV: 87.1 fL (ref 78.0–100.0)
Platelets: 226 10*3/uL (ref 150–400)
RBC: 3.5 MIL/uL — AB (ref 3.87–5.11)
RDW: 12.5 % (ref 11.5–15.5)
WBC: 3.7 10*3/uL — ABNORMAL LOW (ref 4.0–10.5)

## 2015-05-20 MED ORDER — VANCOMYCIN HCL 125 MG PO CAPS: 125.0000 mg | ORAL_CAPSULE | Freq: Four times a day (QID) | ORAL | Status: AC

## 2015-05-20 MED ORDER — POTASSIUM CHLORIDE CRYS ER 20 MEQ PO TBCR
60.0000 meq | EXTENDED_RELEASE_TABLET | Freq: Once | ORAL | Status: AC
  Administered 2015-05-20: 60 meq via ORAL
  Filled 2015-05-20: qty 3

## 2015-05-20 NOTE — Progress Notes (Signed)
Denise Joyce to be D/C'd to home per MD order.  Discussed with the patient and all questions fully answered.  VSS, Skin clean, dry and intact without evidence of skin break down, no evidence of skin tears noted. IV catheter discontinued intact. Site without signs and symptoms of complications. Dressing and pressure applied.  An After Visit Summary was printed and given to the patient. Patient received prescription.  D/c education completed with patient/family including follow up instructions, medication list, d/c activities limitations if indicated, with other d/c instructions as indicated by MD - patient able to verbalize understanding, all questions fully answered.   Patient instructed to return to ED, call 911, or call MD for any changes in condition.   Patient escorted via WC, and D/C home via private auto.  [name] 05/20/2015 11:45 AM

## 2015-05-20 NOTE — Discharge Instructions (Signed)
Norovirus Infection A norovirus infection is caused by exposure to a virus in a group of similar viruses (noroviruses). This type of infection causes inflammation in your stomach and intestines (gastroenteritis). Norovirus is the most common cause of gastroenteritis. It also causes food poisoning. Anyone can get a norovirus infection. It spreads very easily (contagious). You can get it from contaminated food, water, surfaces, or other people. Norovirus is found in the stool or vomit of infected people. You can spread the infection as soon as you feel sick until 2 weeks after you recover.  Symptoms usually begin within 2 days after you become infected. Most norovirus symptoms affect the digestive system. CAUSES Norovirus infection is caused by contact with norovirus. You can catch norovirus if you:  Eat or drink something contaminated with norovirus.  Touch surfaces or objects contaminated with norovirus and then put your hand in your mouth.  Have direct contact with an infected person who has symptoms.  Share food, drink, or utensils with someone with who is sick with norovirus. SIGNS AND SYMPTOMS Symptoms of norovirus may include:  Nausea.  Vomiting.  Diarrhea.  Stomach cramps.  Fever.  Chills.  Headache.  Muscle aches.  Tiredness. DIAGNOSIS Your health care provider may suspect norovirus based on your symptoms and physical exam. Your health care provider may also test a sample of your stool or vomit for the virus.  TREATMENT There is no specific treatment for norovirus. Most people get better without treatment in about 2 days. HOME CARE INSTRUCTIONS  Replace lost fluids by drinking plenty of water or rehydration fluids containing important minerals called electrolytes. This prevents dehydration. Drink enough fluid to keep your urine clear or pale yellow.  Do not prepare food for others while you are infected. Wait at least 3 days after recovering from the illness to do  that. PREVENTION   Wash your hands often, especially after using the toilet or changing a diaper.  Wash fruits and vegetables thoroughly before preparing or serving them.  Throw out any food that a sick person may have touched.  Disinfect contaminated surfaces immediately after someone in the household has been sick. Use a bleach-based household cleaner.  Immediately remove and wash soiled clothes or sheets. SEEK MEDICAL CARE IF:  Your vomiting, diarrhea, and stomach pain is getting worse.  Your symptoms of norovirus do not go away after 2-3 days. SEEK IMMEDIATE MEDICAL CARE IF:  You develop symptoms of dehydration that do not improve with fluid replacement. This may include:  Excessive sleepiness.  Lack of tears.  Dry mouth.  Dizziness when standing.  Weak pulse.   This information is not intended to replace advice given to you by your health care provider. Make sure you discuss any questions you have with your health care provider.   Document Released: 05/01/2002 Document Revised: 03/01/2014 Document Reviewed: 07/19/2013 Elsevier Interactive Patient Education 2016 [employer]  Clostridium Difficile Infection Clostridium difficile (C. difficile or C. diff) is a bacterium normally found in the intestinal tract or colon. C. difficile infection causes diarrhea and sometimes a severe disease called pseudomembranous colitis (C. difficile colitis). C. difficile colitis can damage the lining of the colon or cause the colon to become very large (toxic megacolon). Older adults and people with certain medical conditions have a greater risk of getting C. difficile infections. CAUSES The balance of bacteria in your colon can change when you are sick, especially when taking antibiotic medicine. Taking antibiotics may allow the C. difficile to grow, multiply, and make  a toxin that causes C. difficile infection.  SYMPTOMS  Diarrhea.  Fever.  Fatigue.  Loss of  appetite.  Nausea.  Abdominal swelling, pain, or tenderness.  Dehydration. DIAGNOSIS Your health care provider may suspect C. difficile infection based on your symptoms and if you have taken antibiotics recently. Your health care provider may also order:  A lab test that can detect the toxin in your stool.  A sigmoidoscopy or colonoscopy to look at the appearance of your colon. These procedures involve passing an instrument through your rectum to look at the inside of your colon. Your health care provider will help determine if these tests are necessary. TREATMENT Treatment may include:  Taking antibiotics that keep C. difficile from growing.  Stopping the antibiotics you were on before the C. difficile infection began. Only do this if instructed to do so by your health care provider.  IV fluids and correction of electrolyte imbalance.  Surgery to remove the infected part of the intestines. This is rare. HOME CARE INSTRUCTIONS  Drink enough fluids to keep your urine clear or pale yellow. Avoid milk, caffeine, and alcohol.  Ask your health care provider for specific rehydration instructions.  Eat small, frequent meals rather than large meals.  Take your antibiotics as directed. Finish them even if you start to feel better.  Do not use medicines to slow diarrhea. This could delay healing or cause problems.  Wash your hands thoroughly after using the bathroom and before preparing food. Make sure people who live with you wash their hands often, too.  Clean all surfaces with a product that contains chlorine bleach. SEEK MEDICAL CARE IF:  Your diarrhea lasts longer than expected or comes back after you finish your antibiotic medicine for the C. difficile infection.  You have trouble staying hydrated.  You have a fever. SEEK IMMEDIATE MEDICAL CARE IF:  You have increasing abdominal pain or tenderness.  You have blood in your stools, or your stools look dark black and  tarry.  You cannot eat or drink without vomiting.   This information is not intended to replace advice given to you by your health care provider. Make sure you discuss any questions you have with your health care provider.   Document Released: 11/18/2004 Document Revised: 03/01/2014 Document Reviewed: 08/12/2014 Elsevier Interactive Patient Education [employer]

## 2015-05-20 NOTE — Discharge Summary (Signed)
[facility]  Physician Discharge Summary   Patient ID: Denise Joyce MRN: [id number] DOB/AGE: April 22, 1995 20 y.o.  Admit date: 05/18/2015 Discharge date: 05/20/2015  PCP: No primary care provider on file.  DISCHARGE DIAGNOSES:  Principal Problem:   Nausea vomiting and diarrhea Active Problems:   Abdominal pain   Sepsis (HCC)   Norovirus   RECOMMENDATIONS FOR OUTPATIENT FOLLOW UP: 1. Complete course of oral vancomycin   DISCHARGE CONDITION: fair  Diet recommendation: Full liquids for today and then advance as tolerated  Filed Weights   05/19/15 0625  Weight: 72.303 kg (159 lb 6.4 oz)    INITIAL HISTORY: 20 year old Caucasian female with a past medical history of nephrolithiasis, presented with nausea, vomiting, diarrhea and abdominal pain. CT scan of the abdomen, pelvis was unremarkable for any acute process. Right upper quadrant ultrasound did not show any acute findings in the hepatobiliary system. Patient was hospitalized for further management.   HOSPITAL COURSE:   Nausea, vomiting and diarrhea due to Norovirus Patient slowly started improving. She was given IV fluids and symptomatic treatment. She feels much better this morning. Has tolerated her clear liquids without any nausea, vomiting. Stool is more formed. Only 3 episodes of stool since yesterday morning. Abdominal pain has resolved. GI pathogen panel was positive for Norovirus. This likely explains her symptoms. However, patient also found to have stool positive for C. difficile antigen. However, toxin is negative. She works in an assisted living facility as a Copy. Unclear if this is a clinically significant C. difficile infection or not. She did have elevated WBC when she presented. Oral vancomycin was added. Benefits of treatment outweigh risks. She'll be given a prescription for oral vancomycin. Patient's right upper quadrant pain has resolved. LFTs are normal. Her ultrasound did not show any  gallbladder abnormalities. UA does not show any blood. CT scan was unremarkable.  Dehydration with lactic acidosis Likely due to hypovolemia. Lactic acid has improved. Potassium will be repleted  Normocytic anemia Drop in hemoglobin is likely dilutional. Initial hemoglobin was likely hemoconcentrated. No further episodes of blood in stool. Hemoglobin is stable.   Initially blood glucose levels were noted to be elevated. This is likely due to stress. This morning's fasting level is normal.  Overall much improved. Patient asked not to return to her dorm till her symptoms have completely resolved. Okay for discharge today. All questions answered. She will be leaving with her mother.  PERTINENT LABS:  The results of significant diagnostics from this hospitalization (including imaging, microbiology, ancillary and laboratory) are listed below for reference.    Microbiology: Recent Results (from the past 240 hour(s))  Urine culture     Status: None   Collection Time: 05/19/15  1:33 AM  Result Value Ref Range Status   Specimen Description URINE, RANDOM  Final   Special Requests ADDED [phone]  Final   Culture MULTIPLE SPECIES PRESENT, SUGGEST RECOLLECTION  Final   Report Status 05/20/2015 FINAL  Final  Culture, blood (x 2)     Status: None (Preliminary result)   Collection Time: 05/19/15  4:51 AM  Result Value Ref Range Status   Specimen Description BLOOD RIGHT ARM  Final   Special Requests BOTTLES DRAWN AEROBIC AND ANAEROBIC 5CC   Final   Culture NO GROWTH 1 DAY  Final   Report Status PENDING  Incomplete  Culture, blood (x 2)     Status: None (Preliminary result)   Collection Time: 05/19/15  5:04 AM  Result Value Ref Range Status  Specimen Description BLOOD RIGHT HAND  Final   Special Requests IN PEDIATRIC BOTTLE 3CC  Final   Culture NO GROWTH 1 DAY  Final   Report Status PENDING  Incomplete  C difficile quick scan w PCR reflex     Status: Abnormal   Collection Time: 05/19/15  6:41 AM    Result Value Ref Range Status   C Diff antigen POSITIVE (A) NEGATIVE Final   C Diff toxin NEGATIVE NEGATIVE Final   C Diff interpretation   Final    C. difficile present, but toxin not detected. This indicates colonization. In most cases, this does not require treatment. If patient has signs and symptoms consistent with colitis, consider treatment. Requires ENTERIC precautions.  Gastrointestinal Panel by PCR , Stool     Status: Abnormal   Collection Time: 05/19/15  6:41 AM  Result Value Ref Range Status   Campylobacter species NOT DETECTED NOT DETECTED Final   Plesimonas shigelloides NOT DETECTED NOT DETECTED Final   Salmonella species NOT DETECTED NOT DETECTED Final   Yersinia enterocolitica NOT DETECTED NOT DETECTED Final   Vibrio species NOT DETECTED NOT DETECTED Final   Vibrio cholerae NOT DETECTED NOT DETECTED Final   Enteroaggregative E coli (EAEC) NOT DETECTED NOT DETECTED Final   Enteropathogenic E coli (EPEC) NOT DETECTED NOT DETECTED Final   Enterotoxigenic E coli (ETEC) NOT DETECTED NOT DETECTED Final   Shiga like toxin producing E coli (STEC) NOT DETECTED NOT DETECTED Final   E. coli O157 NOT DETECTED NOT DETECTED Final   Shigella/Enteroinvasive E coli (EIEC) NOT DETECTED NOT DETECTED Final   Cryptosporidium NOT DETECTED NOT DETECTED Final   Cyclospora cayetanensis NOT DETECTED NOT DETECTED Final   Entamoeba histolytica NOT DETECTED NOT DETECTED Final   Giardia lamblia NOT DETECTED NOT DETECTED Final   Adenovirus F40/41 NOT DETECTED NOT DETECTED Final   Astrovirus NOT DETECTED NOT DETECTED Final   Norovirus GI/GII DETECTED (A) NOT DETECTED Final    Comment: CRITICAL RESULT CALLED TO, READ BACK BY AND VERIFIED WITH: [facility] 05/19/15 1455 MLM    Rotavirus A NOT DETECTED NOT DETECTED Final   Sapovirus (I, II, IV, and V) NOT DETECTED NOT DETECTED Final     Labs: Basic Metabolic Panel:  Recent Labs Lab 05/18/15 2315 05/19/15 0011 05/19/15 0456 05/20/15 0527   NA 142 141 143 143  K 4.2 4.1 4.0 3.0*  CL 109 106 114* 114*  CO2 20*  --  20* 24  GLUCOSE 156* 149* 118* 88  BUN 13 15 10  <5*  CREATININE 0.88 0.70 0.76 0.60  CALCIUM 9.4  --  7.5* 7.5*   Liver Function Tests:  Recent Labs Lab 05/18/15 2315 05/19/15 0456 05/20/15 0527  AST 24 16 15   ALT 16 12* 11*  ALKPHOS 42 29* 26*  BILITOT 0.7 0.7 0.3  PROT 6.6 4.4* 4.2*  ALBUMIN 3.9 2.8* 2.4*    Recent Labs Lab 05/18/15 2315  LIPASE 32   CBC:  Recent Labs Lab 05/18/15 2315 05/19/15 0011 05/19/15 0457 05/20/15 0527  WBC 16.7*  --  8.2 3.7*  HGB 13.8 15.6* 10.7* 9.9*  HCT 41.8 46.0 32.0* 30.5*  MCV 85.5  --  86.5 87.1  PLT 409*  --  288 226    IMAGING STUDIES Ct Abdomen Pelvis W Contrast  05/19/2015  CLINICAL DATA:  Acute onset of upper abdominal pain. Nausea, vomiting, diarrhea and lower back pain. Initial encounter. EXAM: CT ABDOMEN AND PELVIS WITH CONTRAST TECHNIQUE: Multidetector CT imaging of the  abdomen and pelvis was performed using the standard protocol following bolus administration of intravenous contrast. CONTRAST:  30mL OMNIPAQUE IOHEXOL 300 MG/ML  SOLN COMPARISON:  Right upper quadrant ultrasound performed 05/18/2015 FINDINGS: The visualized lung bases are clear. The liver and spleen are unremarkable in appearance. The gallbladder is within normal limits. The pancreas and adrenal glands are unremarkable. The kidneys are unremarkable in appearance. There is no evidence of hydronephrosis. No renal or ureteral stones are seen. No perinephric stranding is appreciated. No free fluid is identified. The small bowel is unremarkable in appearance. The stomach is within normal limits. No acute vascular abnormalities are seen. The appendix is normal in caliber, without evidence of appendicitis. The colon is unremarkable in appearance. The bladder is mildly distended and grossly unremarkable. The uterus is unremarkable in appearance. The ovaries are relatively symmetric. No  suspicious adnexal masses are seen. No inguinal lymphadenopathy is seen. No acute osseous abnormalities are identified. IMPRESSION: Unremarkable noncontrast CT of the abdomen and pelvis. Electronically Signed   By: Roanna Raider M.D.   On: 05/19/2015 03:17   US Abdomen Limited Ruq  05/19/2015  CLINICAL DATA:  20 year old female with right upper quadrant abdominal pain EXAM: US ABDOMEN LIMITED - RIGHT UPPER QUADRANT COMPARISON:  None. FINDINGS: Gallbladder: No gallstones or wall thickening visualized. Evaluation for sonographic Murphy's sign was limited as patient was on pain medication. Common bile duct: Diameter: 3 mm Liver: No focal lesion identified. Within normal limits in parenchymal echogenicity. IMPRESSION: Unremarkable right upper quadrant ultrasound. Electronically Signed   By: Elgie Collard M.D.   On: 05/19/2015 00:14    DISCHARGE EXAMINATION: Filed Vitals:   05/19/15 0625 05/19/15 1435 05/19/15 2146 05/20/15 0528  BP: 98/54  Pulse: 103 98 86 78  Temp: 100.1 F (37.8 C)  99 F (37.2 C) 98.3 F (36.8 C)  TempSrc: Oral  Oral Oral  Resp: Height:  (1.676 m)     Weight: 72.303 kg (159 lb 6.4 oz)     SpO2: 100% 100% 100% 100%   General appearance: alert, cooperative, appears stated age and no distress Resp: clear to auscultation bilaterally Cardio: regular rate and rhythm, S1, S2 normal, no murmur, click, rub or gallop GI: soft, non-tender; bowel sounds normal; no masses,  no organomegaly Extremities: extremities normal, atraumatic, no cyanosis or edema  DISPOSITION: Home with mother  Discharge Instructions    Call MD for:  difficulty breathing, headache or visual disturbances    Complete by:  As directed      Call MD for:  extreme fatigue    Complete by:  As directed      Call MD for:  persistant dizziness or light-headedness    Complete by:  As directed      Call MD for:  persistant nausea and vomiting    Complete by:  As directed        Call MD for:  severe uncontrolled pain    Complete by:  As directed      Call MD for:  temperature >100.4    Complete by:  As directed      Discharge instructions    Complete by:  As directed   Please stay well hydrated for next few days. Advance your diet slowly as you improve. You may return to your Dorm and start attending classes as long as you symptoms have subsided.  You were cared for by a hospitalist during your hospital stay. If you have  any questions about your discharge medications or the care you received while you were in the hospital after you are discharged, you can call the unit and asked to speak with the hospitalist on call if the hospitalist that took care of you is not available. Once you are discharged, your primary care physician will handle any further medical issues. Please note that NO REFILLS for any discharge medications will be authorized once you are discharged, as it is imperative that you return to your primary care physician (or establish a relationship with a primary care physician if you do not have one) for your aftercare needs so that they can reassess your need for medications and monitor your lab values. If you do not have a primary care physician, you can call [phone] for a physician referral.     Increase activity slowly    Complete by:  As directed            ALLERGIES:  Allergies  Allergen Reactions  . Sulfa Antibiotics Hives     Discharge Medication List as of 05/20/2015 10:31 AM    START taking these medications   Details  vancomycin (VANCOCIN) 125 MG capsule Take 1 capsule (125 mg total) by mouth 4 (four) times daily. For 13 more days, Starting 05/20/2015, Until Discontinued, Print      CONTINUE these medications which have NOT CHANGED   Details  YAZ 3-0.02 MG tablet Take 1 tablet by mouth daily., Starting 05/02/2015, Until Discontinued, Historical Med         TOTAL DISCHARGE TIME: 35 minutes  [facility]  [facility] Pager [phone]  05/20/2015, 2:06 PM

## 2015-05-24 LAB — CULTURE, BLOOD (ROUTINE X 2)
CULTURE: NO GROWTH
Culture: NO GROWTH

## 2017-04-17 IMAGING — CT CT ABD-PELV W/ CM
2 of 3 series · 10 of 46 positions shown, 11 images · IV contrast (Iodine)
Comparison: Right upper quadrant ultrasound performed 05/18/2015

CLINICAL DATA: Acute onset of upper abdominal pain. Nausea,
vomiting, diarrhea and lower back pain. Initial encounter.

EXAM:
CT ABDOMEN AND PELVIS WITH CONTRAST
TECHNIQUE: Multidetector CT imaging of the abdomen and pelvis was performed
using the standard protocol following bolus administration of
intravenous contrast.
CONTRAST:  30mL OMNIPAQUE IOHEXOL 300 MG/ML  SOLN

[Series 301: routine, idose (2) · axial · 0.78mm/px · z∈[-27,+388]mm · 7 of 103 slices shown, 8 images]
[im 10/103  soft-tissue]
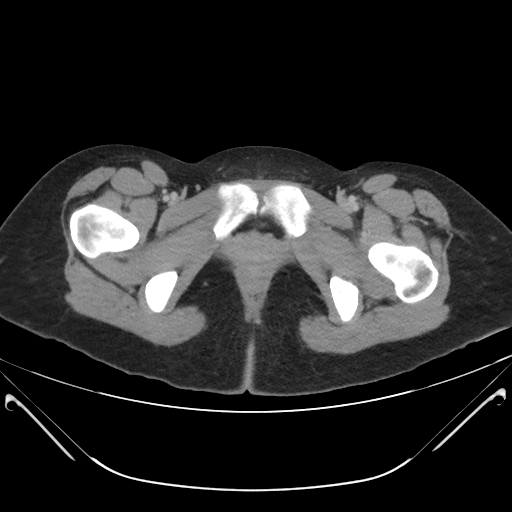
[im 10/103  bone]
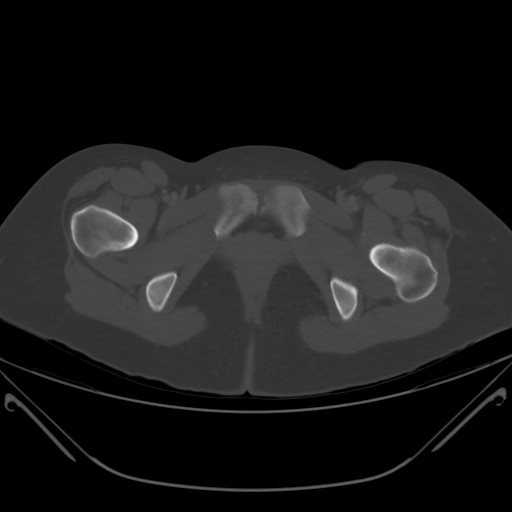
[im 24/103  soft-tissue]
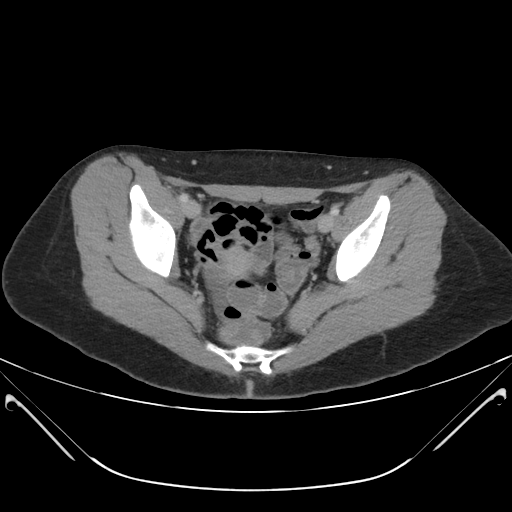
[im 37/103  soft-tissue]
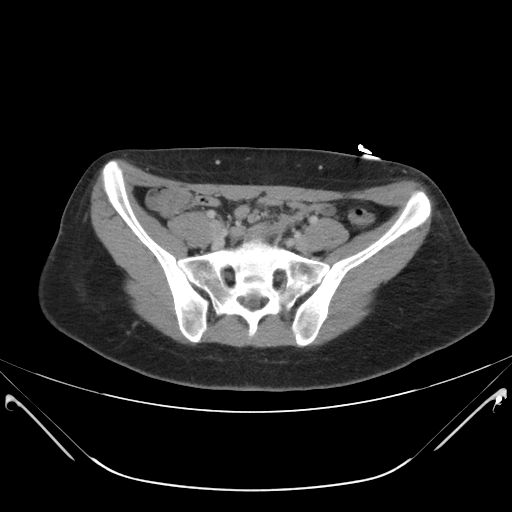
[im 53/103  soft-tissue]
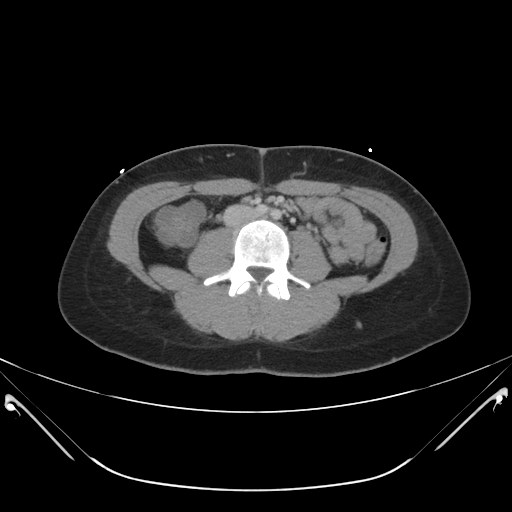
[im 66/103  soft-tissue]
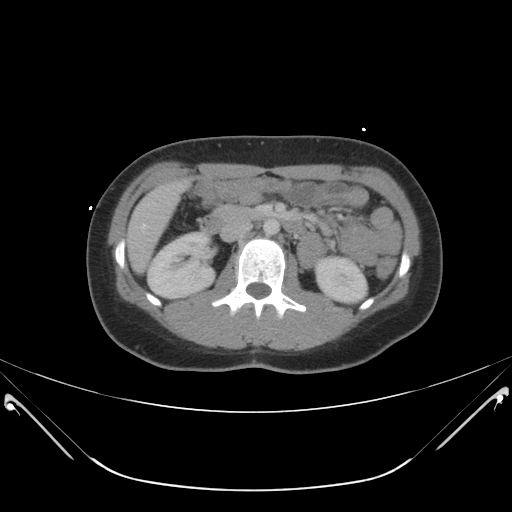
[im 79/103  soft-tissue]
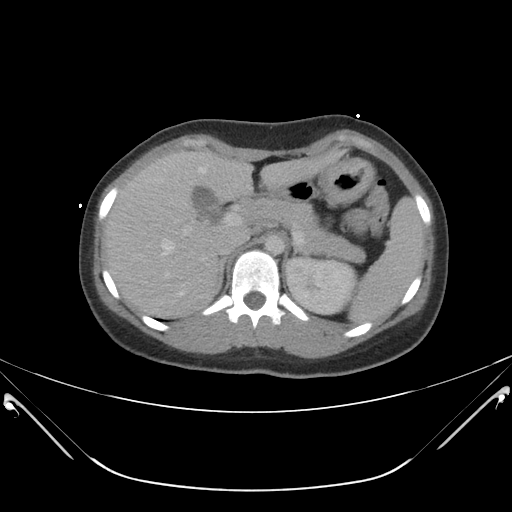
[im 93/103  soft-tissue]
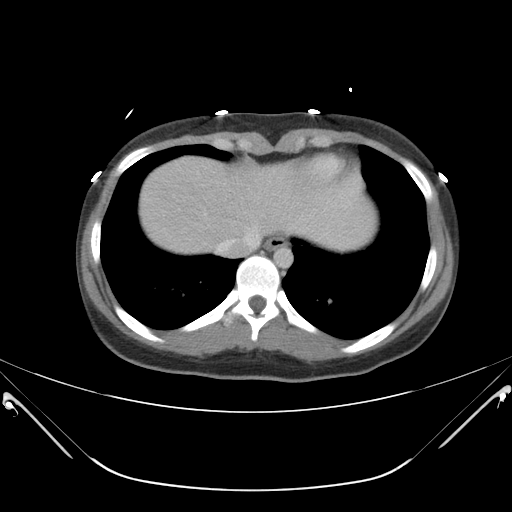

[Series 303: coronals, idose (2) · coronal · 0.45mm/px · 3 of 118 slices shown]
[im 40/118  soft-tissue]
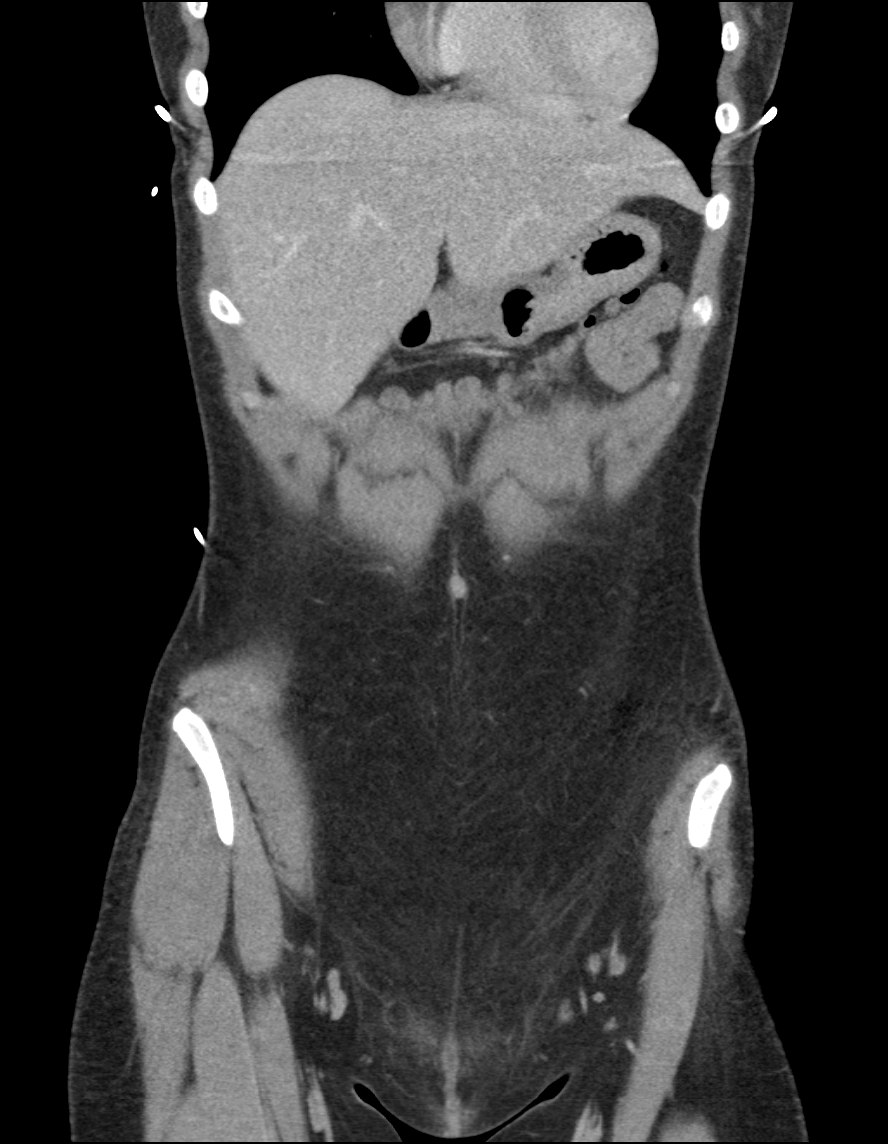
[im 53/118  soft-tissue]
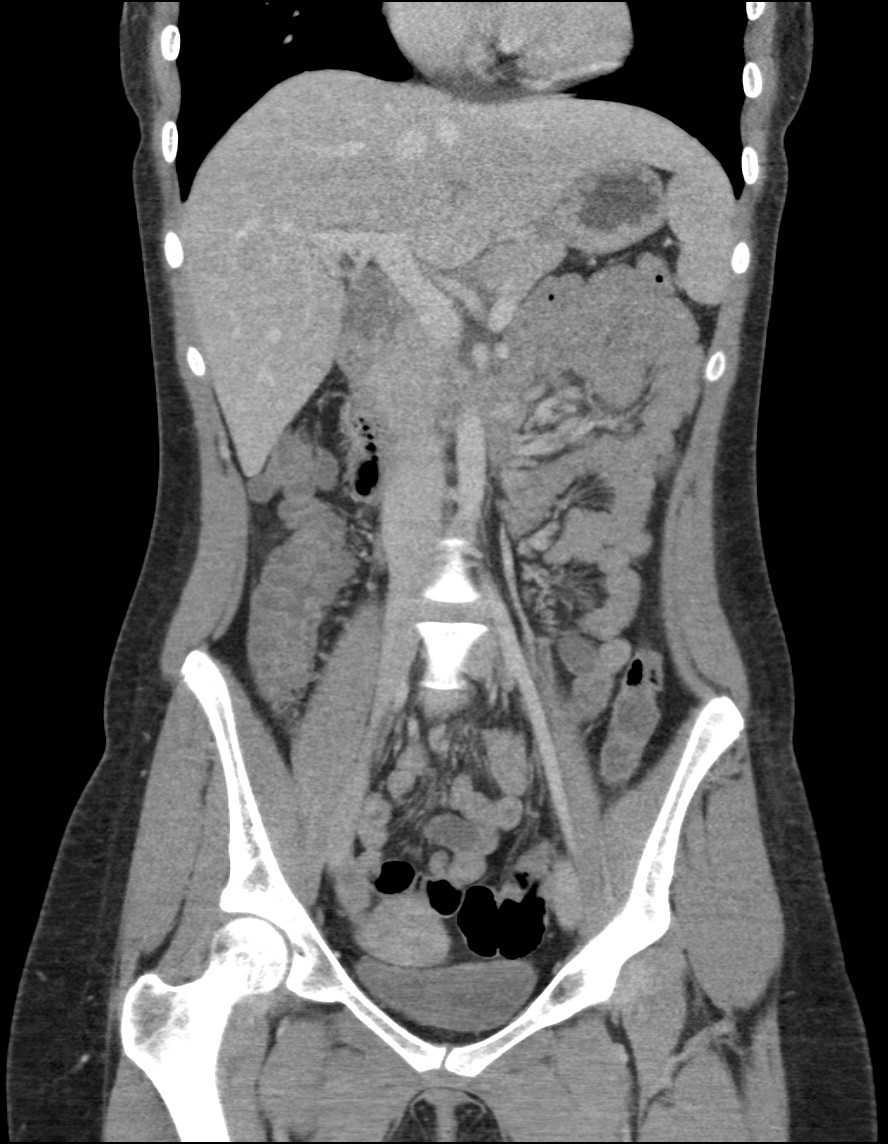
[im 66/118  soft-tissue]
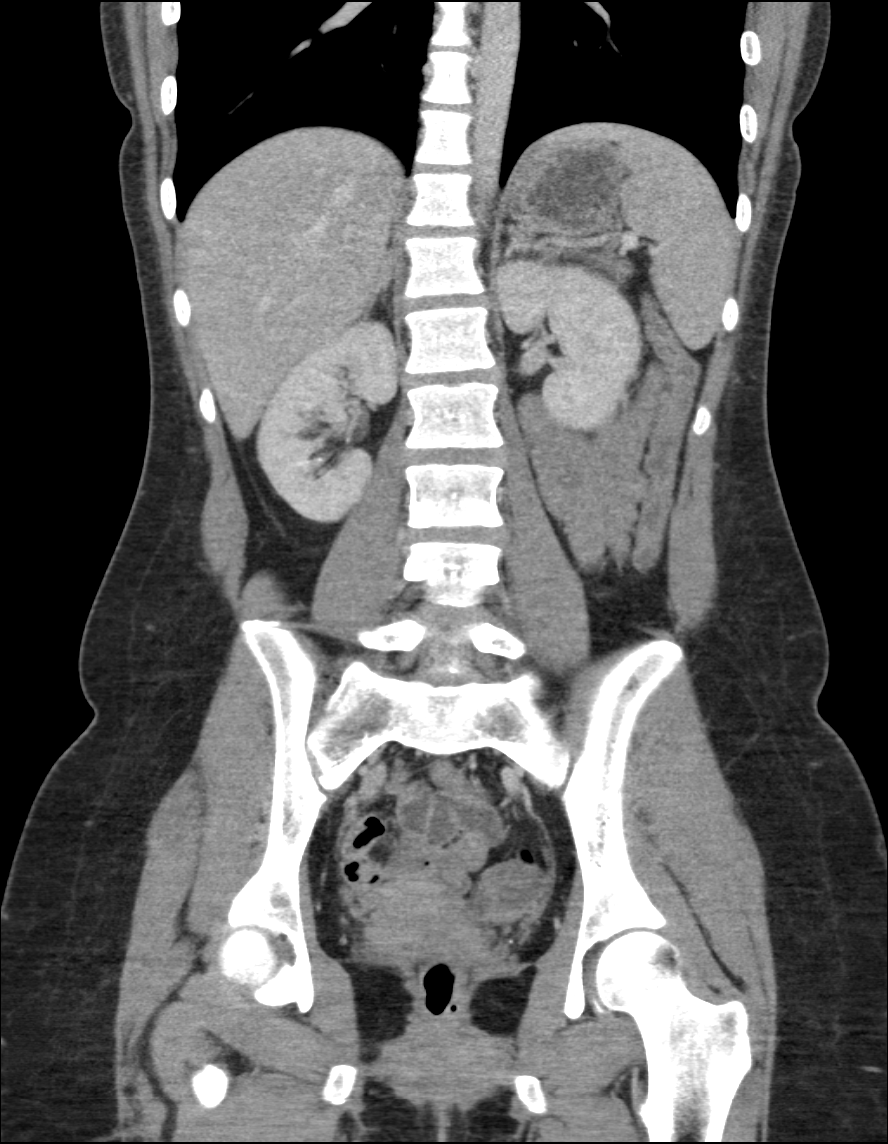

[10 of 46 positions shown; findings below may reference images not displayed]

FINDINGS: The visualized lung bases are clear.

The liver and spleen are unremarkable in appearance. The gallbladder
is within normal limits. The pancreas and adrenal glands are
unremarkable.

The kidneys are unremarkable in appearance. There is no evidence of
hydronephrosis. No renal or ureteral stones are seen. No perinephric
stranding is appreciated.

No free fluid is identified. The small bowel is unremarkable in
appearance. The stomach is within normal limits. No acute vascular
abnormalities are seen.

The appendix is normal in caliber, without evidence of appendicitis.
The colon is unremarkable in appearance.

The bladder is mildly distended and grossly unremarkable. The uterus
is unremarkable in appearance. The ovaries are relatively symmetric.
No suspicious adnexal masses are seen. No inguinal lymphadenopathy
is seen.

No acute osseous abnormalities are identified.
IMPRESSION: Unremarkable noncontrast CT of the abdomen and pelvis.
